# Patient Record
Sex: Male | Born: 1990 | Race: Black or African American | Hispanic: No | Marital: Single | Smoking: Former smoker
Health system: Southern US, Community
[De-identification: ages and names within clinical notes are randomized; demographics above are authoritative.]

## PROBLEM LIST (undated history)

## (undated) DIAGNOSIS — F419 Anxiety disorder, unspecified: Secondary | ICD-10-CM

---

## 2009-08-28 ENCOUNTER — Emergency Department (HOSPITAL_COMMUNITY): Admission: EM | Admit: 2009-08-28 | Discharge: 2009-08-29 | Payer: Self-pay | Admitting: Emergency Medicine

## 2012-03-10 ENCOUNTER — Encounter (HOSPITAL_BASED_OUTPATIENT_CLINIC_OR_DEPARTMENT_OTHER): Payer: Self-pay | Admitting: *Deleted

## 2012-03-10 ENCOUNTER — Emergency Department (INDEPENDENT_AMBULATORY_CARE_PROVIDER_SITE_OTHER): Payer: No Typology Code available for payment source

## 2012-03-10 ENCOUNTER — Emergency Department (HOSPITAL_BASED_OUTPATIENT_CLINIC_OR_DEPARTMENT_OTHER)
Admission: EM | Admit: 2012-03-10 | Discharge: 2012-03-10 | Disposition: A | Discharge status: Home / Self Care | Payer: No Typology Code available for payment source | Attending: Emergency Medicine | Admitting: Emergency Medicine

## 2012-03-10 DIAGNOSIS — S161XXA Strain of muscle, fascia and tendon at neck level, initial encounter: Secondary | ICD-10-CM

## 2012-03-10 DIAGNOSIS — M545 Low back pain, unspecified: Secondary | ICD-10-CM | POA: Insufficient documentation

## 2012-03-10 DIAGNOSIS — R109 Unspecified abdominal pain: Secondary | ICD-10-CM

## 2012-03-10 DIAGNOSIS — Y92410 Unspecified street and highway as the place of occurrence of the external cause: Secondary | ICD-10-CM | POA: Insufficient documentation

## 2012-03-10 DIAGNOSIS — M549 Dorsalgia, unspecified: Secondary | ICD-10-CM | POA: Insufficient documentation

## 2012-03-10 DIAGNOSIS — M542 Cervicalgia: Secondary | ICD-10-CM | POA: Insufficient documentation

## 2012-03-10 DIAGNOSIS — S139XXA Sprain of joints and ligaments of unspecified parts of neck, initial encounter: Secondary | ICD-10-CM | POA: Insufficient documentation

## 2012-03-10 HISTORY — DX: Anxiety disorder, unspecified: F41.9

## 2012-03-10 MED ORDER — IBUPROFEN 600 MG PO TABS: 600.0000 mg | ORAL_TABLET | Freq: Three times a day (TID) | ORAL | Status: AC
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
## 2012-03-10 NOTE — Discharge Instructions (Signed)
Back Exercises Back exercises help treat and prevent back injuries. The goal of back exercises is to increase the strength of your abdominal and back muscles and the flexibility of your back. These exercises should be started when you no longer have back pain. Back exercises include:  Pelvic Tilt. Lie on your back with your knees bent. Tilt your pelvis until the lower part of your back is against the floor. Hold this position 5 to 10 sec and repeat 5 to 10 times.   Knee to Chest. Pull first 1 knee up against your chest and hold for 20 to 30 seconds, repeat this with the other knee, and then both knees. This may be done with the other leg straight or bent, whichever feels better.   Sit-Ups or Curl-Ups. Bend your knees 90 degrees. Start with tilting your pelvis, and do a partial, slow sit-up, lifting your trunk only 30 to 45 degrees off the floor. Take at least 2 to 3 seconds for each sit-up. Do not do sit-ups with your knees out straight. If partial sit-ups are difficult, simply do the above but with only tightening your abdominal muscles and holding it as directed.   Hip-Lift. Lie on your back with your knees flexed 90 degrees. Push down with your feet and shoulders as you raise your hips a couple inches off the floor; hold for 10 seconds, repeat 5 to 10 times.   Back arches. Lie on your stomach, propping yourself up on bent elbows. Slowly press on your hands, causing an arch in your low back. Repeat 3 to 5 times. Any initial stiffness and discomfort should lessen with repetition over time.   Shoulder-Lifts. Lie face down with arms beside your body. Keep hips and torso pressed to floor as you slowly lift your head and shoulders off the floor.  Do not overdo your exercises, especially in the beginning. Exercises may cause you some mild back discomfort which lasts for a few minutes; however, if the pain is more severe, or lasts for more than 15 minutes, do not continue exercises until you see your  caregiver. Improvement with exercise therapy for back problems is slow.  See your caregivers for assistance with developing a proper back exercise program. Document Released: 12/16/2004 Document Revised: 10/28/2011 Document Reviewed: 11/08/2005 [employer] Patient Information 2012 [employer]. 
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
## 2012-03-10 NOTE — ED Notes (Signed)
Pt returned from radiology.

## 2012-03-11 NOTE — ED Provider Notes (Signed)
Medical screening examination/treatment/procedure(s) were performed by non-physician practitioner and as supervising physician I was immediately available for consultation/collaboration.   Carleene Cooper III, MD 03/11/12 1226

## 2015-09-30 ENCOUNTER — Encounter (HOSPITAL_COMMUNITY): Payer: Self-pay

## 2015-09-30 ENCOUNTER — Emergency Department (HOSPITAL_COMMUNITY): Payer: No Typology Code available for payment source

## 2015-09-30 ENCOUNTER — Emergency Department (HOSPITAL_COMMUNITY)
Admission: EM | Admit: 2015-09-30 | Discharge: 2015-09-30 | Disposition: A | Discharge status: Home / Self Care | Payer: No Typology Code available for payment source | Attending: Emergency Medicine | Admitting: Emergency Medicine

## 2015-09-30 DIAGNOSIS — Z720 Tobacco use: Secondary | ICD-10-CM | POA: Diagnosis not present

## 2015-09-30 DIAGNOSIS — H539 Unspecified visual disturbance: Secondary | ICD-10-CM | POA: Insufficient documentation

## 2015-09-30 DIAGNOSIS — S0990XA Unspecified injury of head, initial encounter: Secondary | ICD-10-CM | POA: Insufficient documentation

## 2015-09-30 DIAGNOSIS — M542 Cervicalgia: Secondary | ICD-10-CM

## 2015-09-30 DIAGNOSIS — Y9389 Activity, other specified: Secondary | ICD-10-CM | POA: Diagnosis not present

## 2015-09-30 DIAGNOSIS — Z8659 Personal history of other mental and behavioral disorders: Secondary | ICD-10-CM | POA: Insufficient documentation

## 2015-09-30 DIAGNOSIS — Y92410 Unspecified street and highway as the place of occurrence of the external cause: Secondary | ICD-10-CM | POA: Insufficient documentation

## 2015-09-30 DIAGNOSIS — Y998 Other external cause status: Secondary | ICD-10-CM | POA: Diagnosis not present

## 2015-09-30 DIAGNOSIS — S199XXA Unspecified injury of neck, initial encounter: Secondary | ICD-10-CM | POA: Diagnosis not present

## 2015-09-30 MED ORDER — METHOCARBAMOL 500 MG PO TABS: 500.0000 mg | ORAL_TABLET | Freq: Two times a day (BID) | ORAL | Status: DC

## 2015-09-30 MED ORDER — NAPROXEN 500 MG PO TABS: 500.0000 mg | ORAL_TABLET | Freq: Two times a day (BID) | ORAL | Status: DC

## 2015-09-30 MED ORDER — ACETAMINOPHEN 325 MG PO TABS
650.0000 mg | ORAL_TABLET | Freq: Once | ORAL | Status: AC
  Administered 2015-09-30: 650 mg via ORAL
  Filled 2015-09-30: qty 2

## 2015-09-30 NOTE — Discharge Instructions (Signed)
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 enrolled in [place] or [place] Health Choice; pregnant women with a Medicaid card; and children who have applied for Medicaid or [place] Health Choice, but were declined, whose parents can pay a reduced fee at time of service.  [facility]  Department of [facility]  [address], [place] [phone] Accepts children up to age 31 who are enrolled in [place] or [place] Health Choice; pregnant women with a Medicaid card; and children who have applied for Medicaid or [place] Health Choice, but were declined, whose parents can pay a reduced fee at time of service.  [facility]  [address] [place], [place] [phone] Patients are seen by appointment only. Walk-ins are not accepted. [facility] will see patients 67 years of age and older. Monday - Tuesday (8am-5pm) Most Wednesdays (8:30-5pm) $30 per visit, cash only  [facility]  [address], [facility] [phone] Patients are seen by appointment only. Walk-ins are not accepted. [facility] will see patients 36 years of age and older. One Wednesday Evening (Monthly: Volunteer Based).  $30 per visit, cash only  [employer]  [phone] for adults; Children under age 53, call Graduate Pediatric Dentistry at [phone]. Children aged 6-14, please call [phone] to request a pediatric application.  Dental services are provided in all areas of dental care including fillings, crowns and bridges, complete and partial dentures, implants, gum treatment, root canals, and extractions. Preventive care is also provided. Treatment is provided to both adults and children. Patients are selected via a lottery and there is often a waiting list.   [facility] [address], [place]  [phone] [email or web address]   [facility] [address] [place], [place] [phone], Ext. [phone] Second and Fourth Thursday of each month, opens at 6:30 AM; Clinic ends at 9 AM.  Patients are seen on a first-come first-served basis, and a limited number are seen during each clinic.   [facility]  [address]ins [place], [place] [phone]    Eligibility Requirements You must have lived in [place], [place], or [place] counties for at least the last three months.   You cannot be eligible for state or federal sponsored [place], including [employer], [place], or [employer].   You generally cannot be eligible for healthcare insurance through your employer.    How to apply: Eligibility screenings are held every Tuesday and Wednesday afternoon from 1:00 pm until 4:00 pm. You do not need an appointment for the interview!  [facility] [address] [place], [place] [phone]   [facility] Health Department  [phone]   [facility] Health Department  [phone]   [facility] Health Department  [phone]    Behavioral Health Resources in the Community: Intensive Outpatient Programs Organization         Address  Phone  Notes  [facility] Services [address] [place], [place] [phone]   [facility] [address] [place], [place] [phone]   [facility] [address],  [place], [place]  [phone]   [facility] [address]  [place], [place] [phone] or [phone]   Substance Abuse Resources Organization         Address  Phone  Notes  [facility]  [phone]   [facility]  [phone]   [facility]  [phone]   Floydene Flock  [phone]   Residential & Outpatient Substance Abuse Program  [phone]   Psychological Services Organization         Address  Phone  Notes  [facility] Health  [phone]   [facility]  [phone]   [facility] Mental Health [address], [place] [phone] or [phone]    Mobile Crisis Teams Organization         Address  Phone  Notes  [facility], Mobile Crisis Care Unit  [phone]   [facility]  [address]  [place], [place] [phone]   Doristine Locks [address] [place] [phone]    Self-Help/Support Groups Organization         Address  Phone             Notes  [facility] [place] - variety of support groups  336- I7437963 Call for more information  Narcotics Anonymous (NA), [facility] [address], [employer] [place]  2 meetings at this location   Statistician         Address  Phone  Notes  [facility] [address],    [place]  [phone]   [facility]  [address] [place] [address], [place], [place] [phone]   [facility] [address] [place], [place] [phone] Admissions: 8am-3pm M-F  [facility] [address],    [place], [place] [phone]   [facility] [address] [place], [place], [place] [phone]   [facility] [address]  [place], [place] [phone]   [facility] - Intensive Outpatient [address], [place], [place] [phone]   [facility] [address] [place]  [place], [place] [phone] or [phone]   [facility] ([facility] [address] [place], [place] [phone] Accepts Medicaid  [facility] [address],  [place] [phone] Substance Abuse/Addiction Treatment   [facility] Organization         Address  Phone  Notes  [facility]  [phone]   Angie Fava, PhD [address] Kn[address] [place], [place]   [phone] or [phone]   [facility] Behavioral   [address] [place], [place] [phone]   [facility] [address] [place] [address] [place], [place] [phone] Insurance/Medicaid/sponsorship through [facility] and Families [address]                                    [place], [place] [phone] Therapy/tele-psych/case    [facility] [address], [place] [phone]    Dr. Lolly Mustache  [phone]   [facility]  [employer] [facility] Dept. 1) [address] [place] 2) [address] [place] 3)  [address], [place] [phone]  [phone]   [facility] Child Abuse Hotline (  336) L7645479 or (336) (703)[phone] (After Hours)
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
## 2018-08-07 ENCOUNTER — Emergency Department (HOSPITAL_COMMUNITY): Payer: Self-pay

## 2018-08-07 ENCOUNTER — Emergency Department (HOSPITAL_COMMUNITY)
Admission: EM | Admit: 2018-08-07 | Discharge: 2018-08-07 | Discharge status: Against Medical Advice | Payer: Self-pay | Attending: Emergency Medicine | Admitting: Emergency Medicine

## 2018-08-07 ENCOUNTER — Encounter (HOSPITAL_COMMUNITY): Payer: Self-pay | Admitting: *Deleted

## 2018-08-07 ENCOUNTER — Other Ambulatory Visit: Payer: Self-pay

## 2018-08-07 DIAGNOSIS — S060X1A Concussion with loss of consciousness of 30 minutes or less, initial encounter: Secondary | ICD-10-CM | POA: Insufficient documentation

## 2018-08-07 DIAGNOSIS — Y929 Unspecified place or not applicable: Secondary | ICD-10-CM | POA: Insufficient documentation

## 2018-08-07 DIAGNOSIS — F17200 Nicotine dependence, unspecified, uncomplicated: Secondary | ICD-10-CM | POA: Insufficient documentation

## 2018-08-07 DIAGNOSIS — Y999 Unspecified external cause status: Secondary | ICD-10-CM | POA: Insufficient documentation

## 2018-08-07 DIAGNOSIS — Y939 Activity, unspecified: Secondary | ICD-10-CM | POA: Insufficient documentation

## 2018-08-07 DIAGNOSIS — M542 Cervicalgia: Secondary | ICD-10-CM | POA: Insufficient documentation

## 2018-08-07 DIAGNOSIS — S0083XA Contusion of other part of head, initial encounter: Secondary | ICD-10-CM | POA: Insufficient documentation

## 2018-08-07 MED ORDER — TRAMADOL HCL 50 MG PO TABS: 50.0000 mg | ORAL_TABLET | Freq: Four times a day (QID) | ORAL | 0 refills | Status: AC | PRN

## 2018-08-07 MED ORDER — IBUPROFEN 800 MG PO TABS
800.0000 mg | ORAL_TABLET | Freq: Four times a day (QID) | ORAL | 0 refills | Status: AC | PRN
Start: 2018-08-07 — End: ?

## 2018-08-07 NOTE — ED Notes (Signed)
Bed: WA11 Expected date:  Expected time:  Means of arrival:  Comments: 

## 2018-08-07 NOTE — ED Notes (Signed)
Triage tech attempted to get patient to change into gown for further assessment when roomed to room 11; patient refused and stated "I ain't getting into no gown". Primary RN aware.

## 2018-08-07 NOTE — ED Triage Notes (Signed)
Soft collar placed in triage, pt sitting up in w/c on cell phone.

## 2018-08-07 NOTE — ED Notes (Signed)
Patient transported to CT 

## 2018-08-07 NOTE — ED Notes (Signed)
Pt ambulatory out of triage 1, has removed his c collar. States that he is "going home". Ambulatory with his family out of ED lobby doors.
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
## 2018-08-07 NOTE — ED Notes (Signed)
Bed: [facility] Expected date:  Expected time:  Means of arrival:  Comments: 

## 2018-08-07 NOTE — ED Notes (Signed)
Pt now returned ambulatory with his family. His aunt is standing behind him yelling that the patient "needs to be seen" at triage doors but the patient is not willing. I have explained to them that he is of age and can make that decision. Pt agreeable to stay at this time. In lobby, awaiting treatment room at this time.
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
## 2024-07-16 ENCOUNTER — Ambulatory Visit (INDEPENDENT_AMBULATORY_CARE_PROVIDER_SITE_OTHER)

## 2024-07-16 ENCOUNTER — Ambulatory Visit
Admission: RE | Admit: 2024-07-16 | Discharge: 2024-07-16 | Disposition: A | Discharge status: Home / Self Care | Payer: Self-pay | Source: Ambulatory Visit | Attending: Family Medicine | Admitting: Family Medicine

## 2024-07-16 ENCOUNTER — Ambulatory Visit (HOSPITAL_COMMUNITY): Payer: Self-pay

## 2024-07-16 VITALS — BP 106/70 | HR 71 | Temp 98.8°F | Resp 16

## 2024-07-16 DIAGNOSIS — M79642 Pain in left hand: Secondary | ICD-10-CM | POA: Diagnosis not present

## 2024-07-16 DIAGNOSIS — M79645 Pain in left finger(s): Secondary | ICD-10-CM | POA: Diagnosis not present

## 2024-07-16 MED ORDER — NAPROXEN 500 MG PO TABS: 500.0000 mg | ORAL_TABLET | Freq: Two times a day (BID) | ORAL | 0 refills | Status: AC

## 2024-07-16 MED ORDER — AMOXICILLIN-POT CLAVULANATE 875-125 MG PO TABS: 1.0000 | ORAL_TABLET | Freq: Two times a day (BID) | ORAL | 0 refills | Status: AC
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
## 2024-07-16 NOTE — Discharge Instructions (Addendum)
 I will call you as soon as I have the report from the radiology department.  My primary concern is that you have an infection involving your bone or the soft tissue of your left middle finger.  This is a problem called osteomyelitis or a felon.  Both are medical emergencies.  My effort is to rule this out using the x-ray.  Otherwise, I may be able to prescribe oral antibiotics and have you follow-up urgently with the hand surgery clinic tomorrow unless your x-ray report requires that I send you to the emergency room today.
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
